# Patient Record
Sex: Female | Born: 2000 | Race: Black or African American | Hispanic: No | Marital: Single | Smoking: Never smoker
Health system: Southern US, Community
[De-identification: ages and names within clinical notes are randomized; demographics above are authoritative.]

## PROBLEM LIST (undated history)

## (undated) ENCOUNTER — Emergency Department (HOSPITAL_COMMUNITY): Discharge status: Absent without leave | Payer: Medicaid Other

---
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
## 2019-01-12 ENCOUNTER — Other Ambulatory Visit: Payer: Self-pay

## 2019-01-12 DIAGNOSIS — Z20822 Contact with and (suspected) exposure to covid-19: Secondary | ICD-10-CM

## 2019-01-13 LAB — NOVEL CORONAVIRUS, NAA: SARS-CoV-2, NAA: NOT DETECTED

## 2019-01-20 ENCOUNTER — Other Ambulatory Visit: Payer: Medicaid Other

## 2019-03-27 ENCOUNTER — Other Ambulatory Visit: Payer: Medicaid Other

## 2019-07-21 ENCOUNTER — Encounter: Payer: Medicaid Other | Admitting: Certified Nurse Midwife

## 2019-09-08 ENCOUNTER — Emergency Department
Admission: EM | Admit: 2019-09-08 | Discharge: 2019-09-09 | Disposition: A | Discharge status: Home / Self Care | Payer: Medicaid Other | Attending: Emergency Medicine | Admitting: Emergency Medicine

## 2019-09-08 ENCOUNTER — Emergency Department: Payer: Medicaid Other

## 2019-09-08 ENCOUNTER — Encounter: Payer: Self-pay | Admitting: Emergency Medicine

## 2019-09-08 DIAGNOSIS — Z20822 Contact with and (suspected) exposure to covid-19: Secondary | ICD-10-CM | POA: Insufficient documentation

## 2019-09-08 DIAGNOSIS — O99891 Other specified diseases and conditions complicating pregnancy: Secondary | ICD-10-CM | POA: Diagnosis not present

## 2019-09-08 DIAGNOSIS — Z3A15 15 weeks gestation of pregnancy: Secondary | ICD-10-CM | POA: Insufficient documentation

## 2019-09-08 DIAGNOSIS — J069 Acute upper respiratory infection, unspecified: Secondary | ICD-10-CM

## 2019-09-08 DIAGNOSIS — R102 Pelvic and perineal pain: Secondary | ICD-10-CM

## 2019-09-08 LAB — COMPREHENSIVE METABOLIC PANEL
ALT: 18 U/L (ref 0–44)
AST: 19 U/L (ref 15–41)
Albumin: 3.8 g/dL (ref 3.5–5.0)
Alkaline Phosphatase: 43 U/L (ref 38–126)
Anion gap: 7 (ref 5–15)
BUN: 7 mg/dL (ref 6–20)
CO2: 24 mmol/L (ref 22–32)
Calcium: 9.4 mg/dL (ref 8.9–10.3)
Chloride: 105 mmol/L (ref 98–111)
Creatinine, Ser: 0.64 mg/dL (ref 0.44–1.00)
GFR calc Af Amer: >60 mL/min (ref >60)
GFR calc non Af Amer: >60 mL/min (ref >60)
Glucose, Bld: 85 mg/dL (ref 70–99)
Potassium: 3.6 mmol/L (ref 3.5–5.1)
Sodium: 136 mmol/L (ref 135–145)
Total Bilirubin: 0.5 mg/dL (ref 0.3–1.2)
Total Protein: 7.4 g/dL (ref 6.5–8.1)

## 2019-09-08 LAB — CBC
HCT: 39.5 % (ref 36.0–46.0)
Hemoglobin: 13.5 g/dL (ref 12.0–15.0)
MCH: 31.2 pg (ref 26.0–34.0)
MCHC: 34.2 g/dL (ref 30.0–36.0)
MCV: 91.2 fL (ref 80.0–100.0)
Platelets: 227 10*3/uL (ref 150–400)
RBC: 4.33 MIL/uL (ref 3.87–5.11)
RDW: 12.8 % (ref 11.5–15.5)
WBC: 14.8 10*3/uL — ABNORMAL HIGH (ref 4.0–10.5)
nRBC: 0 % (ref 0.0–0.2)

## 2019-09-08 LAB — URINALYSIS, COMPLETE (UACMP) WITH MICROSCOPIC
Bilirubin Urine: NEGATIVE
Glucose, UA: NEGATIVE mg/dL
Hgb urine dipstick: NEGATIVE
Ketones, ur: 5 mg/dL — AB
Leukocytes,Ua: NEGATIVE
Nitrite: NEGATIVE
Protein, ur: NEGATIVE mg/dL
Specific Gravity, Urine: 1.016 (ref 1.005–1.030)
pH: 7 (ref 5.0–8.0)

## 2019-09-08 LAB — HCG, QUANTITATIVE, PREGNANCY: hCG, Beta Chain, Quant, S: 51111 m[IU]/mL — ABNORMAL HIGH (ref <5)

## 2019-09-08 LAB — LIPASE, BLOOD: Lipase: 26 U/L (ref 11–51)

## 2019-09-08 NOTE — ED Triage Notes (Signed)
Pt c/o cough, sore throat and nasal congestion x1 week.Today pt started to have lower abdominal pain. Pt is [redacted] weeks pregnant. Pt denies vaginal bleeding and diarrhea but has had N/V.

## 2019-09-09 ENCOUNTER — Emergency Department: Payer: Medicaid Other

## 2019-09-09 LAB — SARS CORONAVIRUS 2 BY RT PCR (HOSPITAL ORDER, PERFORMED IN ~~LOC~~ HOSPITAL LAB): SARS Coronavirus 2: NEGATIVE

## 2019-09-09 MED ORDER — DM-GUAIFENESIN ER 30-600 MG PO TB12
1.0000 | ORAL_TABLET | Freq: Two times a day (BID) | ORAL | Status: DC
  Administered 2019-09-09: 1 via ORAL

## 2019-09-09 MED ORDER — DM-GUAIFENESIN ER 30-600 MG PO TB12
1.0000 | ORAL_TABLET | Freq: Two times a day (BID) | ORAL | Status: DC
  Administered 2019-09-09: 1 via ORAL
  Filled 2019-09-09 (×2): qty 1

## 2019-09-09 MED ORDER — AMOXICILLIN-POT CLAVULANATE 875-125 MG PO TABS: 1.0000 | ORAL_TABLET | Freq: Two times a day (BID) | ORAL | 0 refills | Status: AC

## 2019-09-09 MED ORDER — AMOXICILLIN-POT CLAVULANATE 875-125 MG PO TABS
1.0000 | ORAL_TABLET | Freq: Once | ORAL | Status: AC
  Administered 2019-09-09: 1 via ORAL
  Filled 2019-09-09: qty 1

## 2019-09-09 NOTE — ED Provider Notes (Signed)
[facility] Emergency Department Provider Note  ____________________________________________   First MD Initiated Contact with Patient 09/08/19 2351     (approximate)  I have reviewed the triage vital signs and the nursing notes.   HISTORY  Chief Complaint Cough and Abdominal Pain    HPI Jasmine [name] is a 19 y.o. female G1, P0 approximately [redacted] weeks pregnant presents to the emergency department secondary to 1 week history of nonproductive cough sore throat nasal congestion.  Patient states that she had subjective fevers at home as well.  Patient states that she notes pelvic pain with coughing.  Patient denies any vaginal bleeding no discharge.       History reviewed. No pertinent past medical history.  There are no problems to display for this patient.   History reviewed. No pertinent surgical history.  Prior to Admission medications   Medication Sig Start Date End Date Taking? Authorizing Provider  albuterol (PROVENTIL HFA;VENTOLIN HFA) 108 (90 Base) MCG/ACT inhaler Inhale 2 puffs into the lungs every 6 (six) hours as needed for wheezing or shortness of breath. Patient not taking: Reported on 06/28/2017 03/04/16   Nita Sickle, MD  guaiFENesin-codeine 100-10 MG/5ML syrup Take 5 mLs by mouth every 6 (six) hours as needed for cough. 06/28/17   [name], MD  ibuprofen (ADVIL,MOTRIN) 600 MG tablet Take 1 tablet (600 mg total) every 8 (eight) hours as needed by mouth. Patient not taking: Reported on 06/28/2017 02/03/17   [name], Charlesetta Ivory, PA-C    Allergies Patient has no known allergies.  History reviewed. No pertinent family history.  Social History Social History   Tobacco Use  . Smoking status: Never Smoker  . Smokeless tobacco: Never Used  Substance Use Topics  . Alcohol use: No  . Drug use: Yes    Types: Marijuana    Review of Systems Constitutional: No fever/chills Eyes: No visual changes. ENT: Positive  for nasal congestion and sore throat Cardiovascular: Denies chest pain. Respiratory: Positive for cough Gastrointestinal: No abdominal pain.  No nausea, no vomiting.  No diarrhea.  No constipation. Genitourinary: Negative for dysuria. Musculoskeletal: Negative for neck pain.  Negative for back pain. Integumentary: Negative for rash. Neurological: Negative for headaches, focal weakness or numbness.   ____________________________________________   PHYSICAL EXAM:  VITAL SIGNS: ED Triage Vitals  Enc Vitals Group     BP 09/08/19 2138 117/87     Pulse Rate 09/08/19 2138 86     Resp 09/08/19 2138 18     Temp 09/08/19 2138 98.8 F (37.1 C)     Temp Source 09/08/19 2138 Oral     SpO2 09/08/19 2138 100 %     Weight --      Height --      Head Circumference --      Peak Flow --      Pain Score 09/09/19 0029 6     Pain Loc --      Pain Edu? --      Excl. in GC? --    Constitutional: Alert and oriented.  Eyes: Conjunctivae are normal.  Mouth/Throat: No pharyngeal erythema or exudate noted. Neck: No stridor.  No meningeal signs.   Cardiovascular: Normal rate, regular rhythm. Good peripheral circulation. Grossly normal heart sounds. Respiratory: Normal respiratory effort.  No retractions. Gastrointestinal: Soft and nontender. No distention.  Musculoskeletal: No lower extremity tenderness nor edema. No gross deformities of extremities. Neurologic:  Normal speech and language. No gross focal neurologic deficits are appreciated.  Skin:  Skin is warm, dry and intact. Psychiatric: Mood and affect are normal. Speech and behavior are normal.  ____________________________________________   LABS (all labs ordered are listed, but only abnormal results are displayed)  Labs Reviewed  CBC - Abnormal; Notable for the following components:      Result Value   WBC 14.8 (*)    All other components within normal limits  URINALYSIS, COMPLETE (UACMP) WITH MICROSCOPIC - Abnormal; Notable for the  following components:   Color, Urine YELLOW (*)    APPearance CLOUDY (*)    Ketones, ur 5 (*)    Bacteria, UA RARE (*)    All other components within normal limits  HCG, QUANTITATIVE, PREGNANCY - Abnormal; Notable for the following components:   hCG, Beta Chain, Quant, S 51,111 (*)    All other components within normal limits  SARS CORONAVIRUS 2 BY RT PCR (HOSPITAL ORDER, [place])  LIPASE, BLOOD  COMPREHENSIVE METABOLIC PANEL   _________  RADIOLOGY I, Lockhart, personally viewed and evaluated these images (plain radiographs) as part of my medical decision making, as well as reviewing the written report by the radiologist.  ED MD interpretation: No active cardiopulmonary disease noted on chest x-ray per radiologist.  Official radiology report(s): DG Chest 2 View  Result Date: 09/08/2019 CLINICAL DATA:  Cough x1 week. EXAM: CHEST - 2 VIEW COMPARISON:  June 28, 2017 FINDINGS: The heart size and mediastinal contours are within normal limits. Both lungs are clear. The visualized skeletal structures are unremarkable. IMPRESSION: No active cardiopulmonary disease. Electronically Signed   By: Virgina Norfolk M.D.   On: 09/08/2019 21:56      Procedures   ____________________________________________   INITIAL IMPRESSION / MDM / ASSESSMENT AND PLAN / ED COURSE  As part of my medical decision making, I reviewed the following data within the electronic MEDICAL RECORD NUMBER  19 year old female presented with above-stated history and physical exam a differential diagnosis including but not limited to upper respiratory infection, bronchitis, pneumonia, sinusitis,.  Chest x-ray revealed no evidence of pneumonia.  Laboratory data unremarkable ultrasound was performed secondary to stated abdominal discomfort.  Ultrasound revealed single intrauterine pregnancy 15 weeks 2 days without any acute complication per radiologist.   ____________________________________________  FINAL CLINICAL IMPRESSION(S) / ED DIAGNOSES  Final diagnoses:  Upper respiratory tract infection, unspecified type     MEDICATIONS GIVEN DURING THIS VISIT:  Medications  dextromethorphan-guaiFENesin (MUCINEX DM) 30-600 MG per 12 hr tablet 1 tablet (1 tablet Oral Given 09/09/19 0024)  amoxicillin-clavulanate (AUGMENTIN) 875-125 MG per tablet 1 tablet (1 tablet Oral Given 09/09/19 0024)     ED Discharge Orders    None      *Please note:  Jasmine [name] was evaluated in Emergency Department on 09/09/2019 for the symptoms described in the history of present illness. She was evaluated in the context of the global COVID-19 pandemic, which necessitated consideration that the patient might be at risk for infection with the SARS-CoV-2 virus that causes COVID-19. Institutional protocols and algorithms that pertain to the evaluation of patients at risk for COVID-19 are in a state of rapid change based on information released by regulatory bodies including the CDC and federal and state organizations. These policies and algorithms were followed during the patient's care in the ED.  Some ED evaluations and interventions may be delayed as a result of limited staffing during and after the pandemic.*  Note:  This document was prepared using Systems analyst  and may include unintentional dictation errors.   Darci Current, MD 09/09/19 [phone]

## 2019-09-09 NOTE — ED Notes (Signed)
US at bedside at this time 

## 2022-03-06 IMAGING — CR DG CHEST 2V
1 series · 2 of 2 positions shown · non-contrast
Comparison: June 28, 2017

CLINICAL DATA: Cough x1 week.

EXAM:
CHEST - 2 VIEW

[Series 1: dg chest 2 view · 0.14mm/px · 2 of 2 slices shown]
[im 1/2]
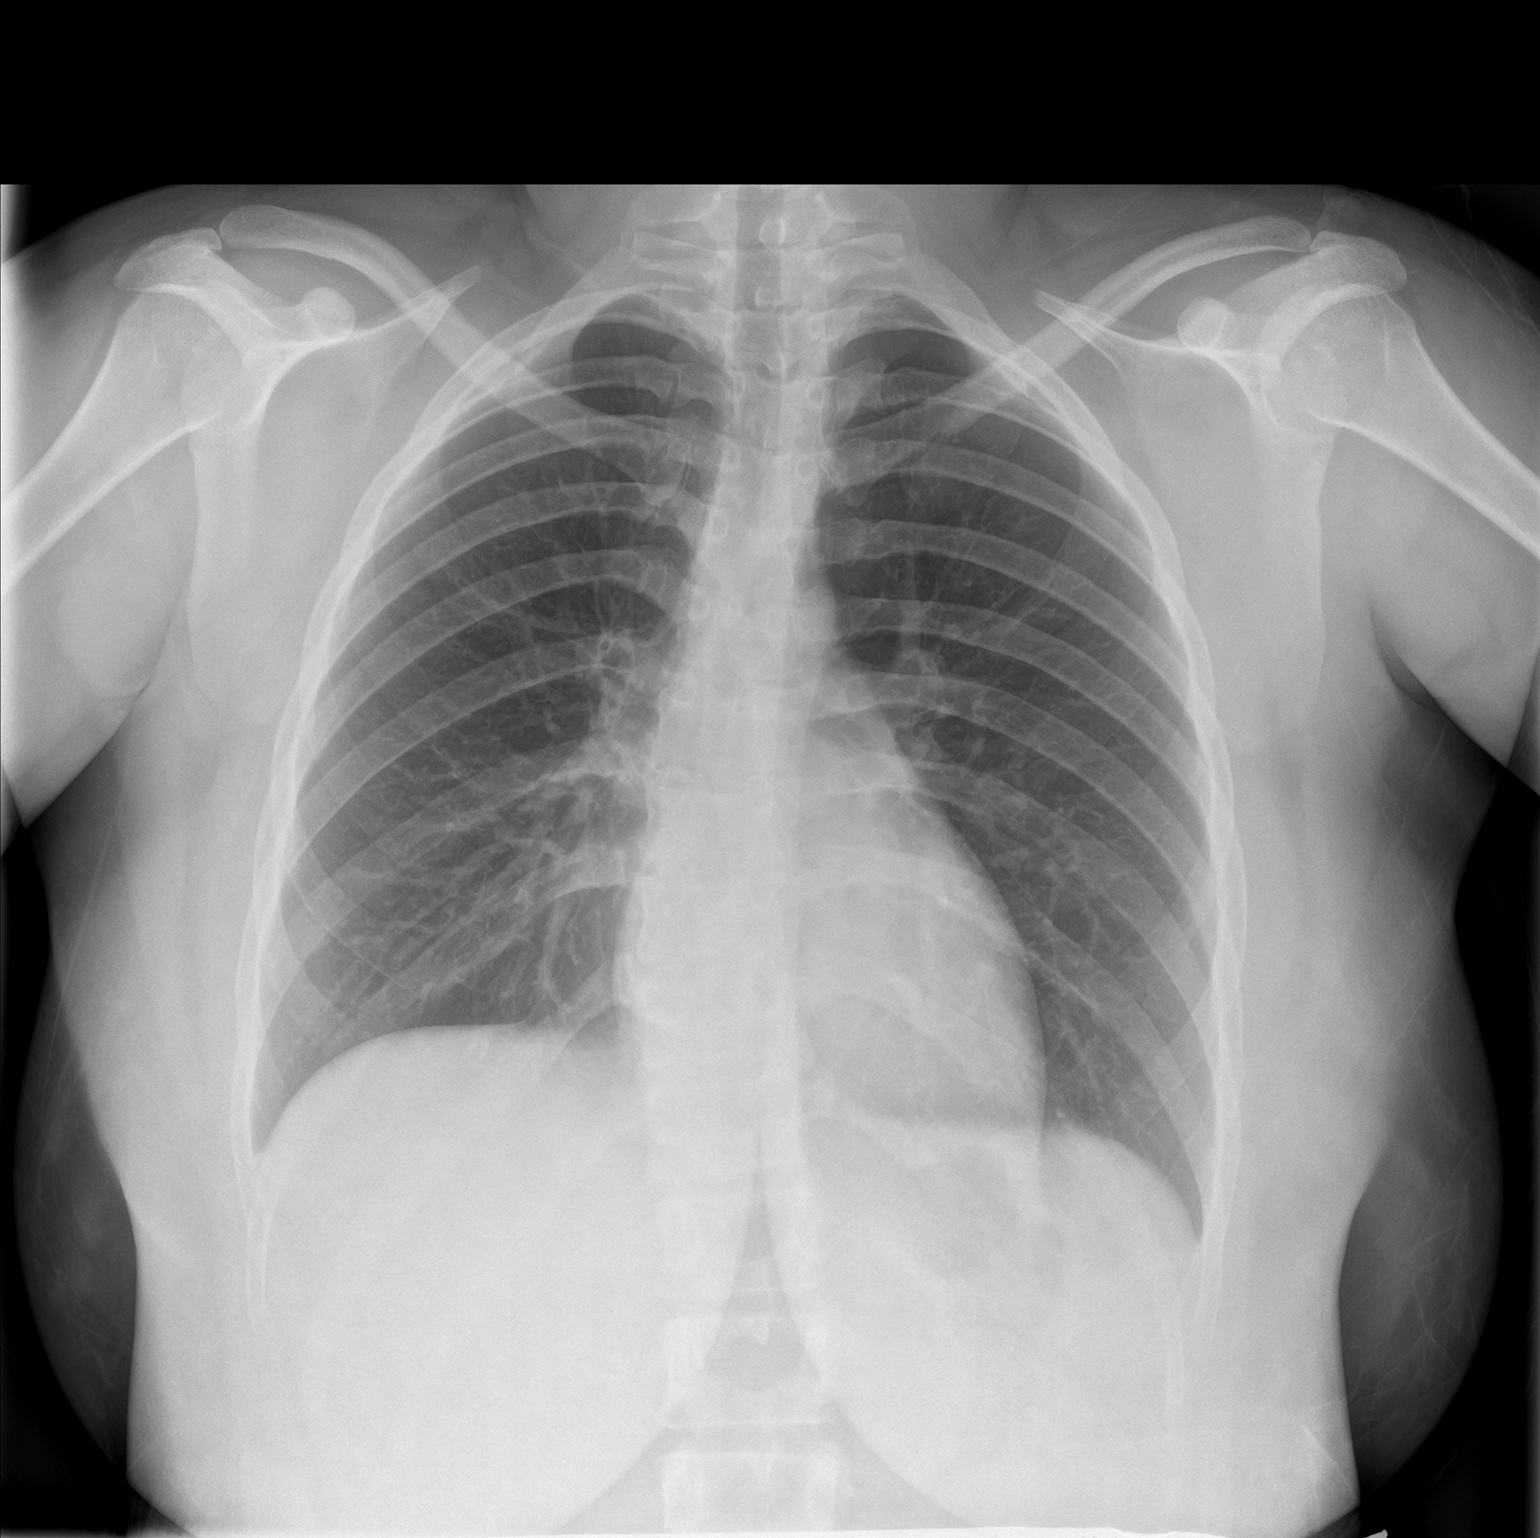
[im 2/2]
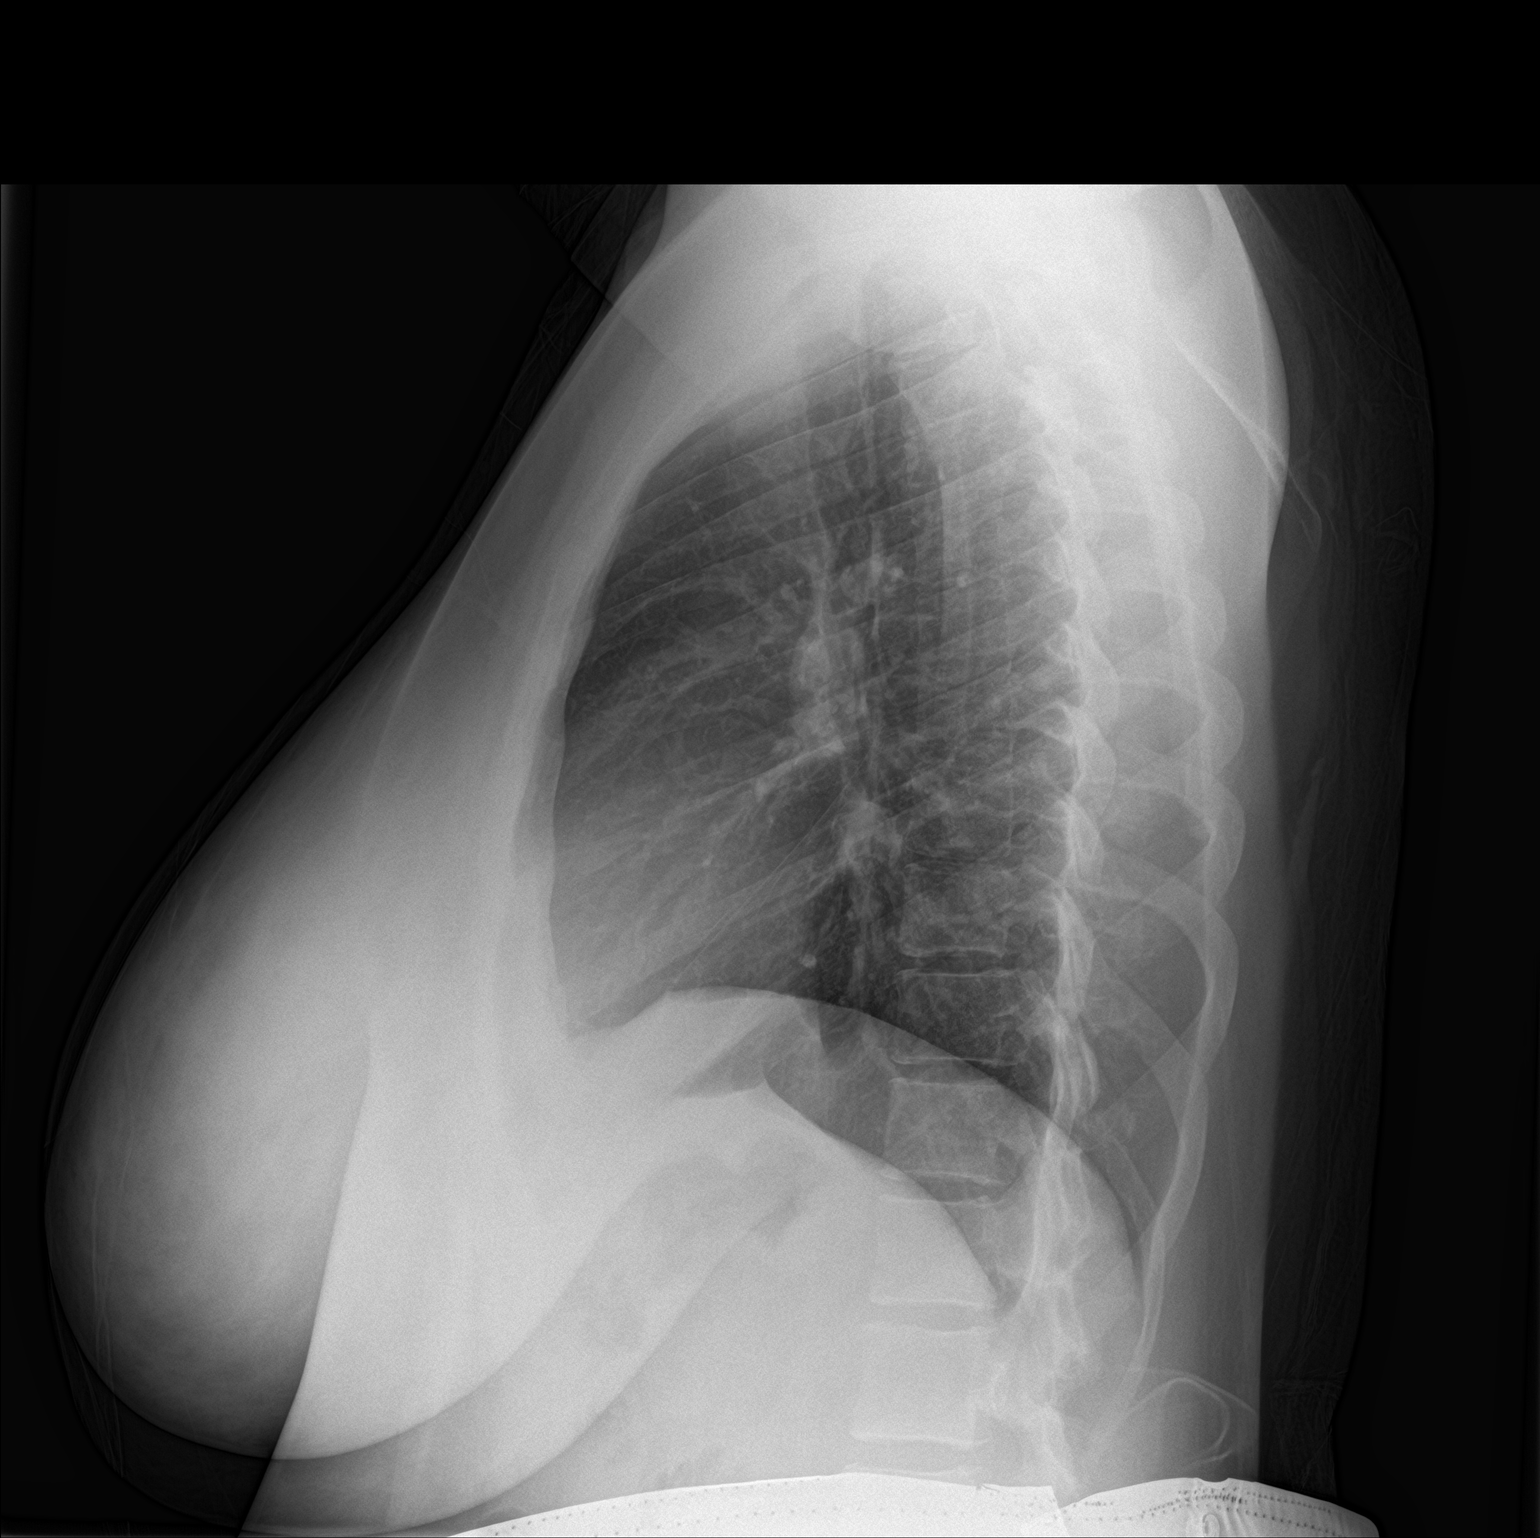

[2 of 2 positions shown; findings below may reference images not displayed]

FINDINGS: The heart size and mediastinal contours are within normal limits.
Both lungs are clear. The visualized skeletal structures are
unremarkable.
IMPRESSION: No active cardiopulmonary disease.

## 2022-07-19 ENCOUNTER — Encounter: Payer: Self-pay | Admitting: Obstetrics and Gynecology

## 2022-07-19 ENCOUNTER — Ambulatory Visit (INDEPENDENT_AMBULATORY_CARE_PROVIDER_SITE_OTHER): Payer: Medicaid Other | Admitting: Obstetrics and Gynecology

## 2022-07-19 VITALS — BP 118/82 | HR 97 | Ht 63.0 in | Wt 213.0 lb

## 2022-07-19 DIAGNOSIS — Z7689 Persons encountering health services in other specified circumstances: Secondary | ICD-10-CM

## 2022-07-19 DIAGNOSIS — N750 Cyst of Bartholin's gland: Secondary | ICD-10-CM | POA: Diagnosis not present

## 2022-07-19 NOTE — Progress Notes (Signed)
HPI:      [name] is a 22 y.o. G1P0 who LMP was Patient's last menstrual period was 06/28/2022.  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  Reviewing her chart from [facility] is noted that she had a Bartholin cyst where a Word catheter was placed.  Patient states that she has a follow-up appointment scheduled in [facility] in June for possible surgical management.  She desires surgical management. She reports that today she has a small cystic area present but it is not as tender as it usually gets and not as large as it usually gets. She also states that she has previously been told it might be a Gartner's duct cyst.    Hx: The following portions of the patient's history were reviewed and updated as appropriate:             She  has no past medical history on file. She does not have a problem list on file. She  has no past surgical history on file. Her family history is not on file. She  reports that she has never smoked. She has never used smokeless tobacco. She reports current drug use. Drug: Marijuana. She reports that she does not drink alcohol. She has a current medication list which includes the following prescription(s): albuterol, guaifenesin-codeine, and ibuprofen. She has No Known Allergies.       Review of Systems:  Review of Systems  Constitutional: Denied constitutional symptoms, night sweats, recent illness, fatigue, fever, insomnia and weight loss.  Eyes: Denied eye symptoms, eye pain, photophobia, vision change and visual disturbance.  Ears/Nose/Throat/Neck: Denied ear, nose, throat or neck symptoms, hearing loss, nasal discharge, sinus congestion and sore throat.  Cardiovascular: Denied cardiovascular symptoms, arrhythmia, chest pain/pressure, edema, exercise intolerance, orthopnea and palpitations.  Respiratory: Denied pulmonary symptoms,  asthma, pleuritic pain, productive sputum, cough, dyspnea and wheezing.  Gastrointestinal: Denied, gastro-esophageal reflux, melena, nausea and vomiting.  Genitourinary: See HPI for additional information.  Musculoskeletal: Denied musculoskeletal symptoms, stiffness, swelling, muscle weakness and myalgia.  Dermatologic: Denied dermatology symptoms, rash and scar.  Neurologic: Denied neurology symptoms, dizziness, headache, neck pain and syncope.  Psychiatric: Denied psychiatric symptoms, anxiety and depression.  Endocrine: Denied endocrine symptoms including hot flashes and night sweats.   Meds:   Current Outpatient Medications on File Prior to Visit  Medication Sig Dispense Refill   albuterol (PROVENTIL HFA;VENTOLIN HFA) 108 (90 Base) MCG/ACT inhaler Inhale 2 puffs into the lungs every 6 (six) hours as needed for wheezing or shortness of breath. (Patient not taking: Reported on 06/28/2017) 1 Inhaler 2   guaiFENesin-codeine 100-10 MG/5ML syrup Take 5 mLs by mouth every 6 (six) hours as needed for cough. (Patient not taking: Reported on 07/19/2022) 120 mL 0   ibuprofen (ADVIL,MOTRIN) 600 MG tablet Take 1 tablet (600 mg total) every 8 (eight) hours as needed by mouth. (Patient not taking: Reported on 06/28/2017) 30 tablet 0   No current facility-administered medications on file prior to visit.      Objective:     Vitals:   07/19/22 1354  BP: 118/82  Pulse: 97   Filed Weights   07/19/22 1354  Weight: 213 lb (96.6 kg)              Physical examination   Pelvic:   Vulva: Small left labial cystic area consistent with possible Bartholin gland cyst.  Minimal tenderness to palpation.  Vagina: No lesions or abnormalities noted.  Support: Normal pelvic support.  Urethra No masses tenderness or scarring.  Meatus Normal size without lesions or prolapse.  Cervix: Normal appearance.  No lesions.  Anus: Normal exam.  No lesions.  Perineum: Normal exam.  No lesions.        Bimanual   Uterus:  Normal size.  Non-tender.  Mobile.  AV.  Adnexae: No masses.  Non-tender to palpation.  Cul-de-sac: Negative for abnormality.             Assessment:    G1P0 There are no problems to display for this patient.    1. Establishing care with new doctor, encounter for   2. 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  Not a Gartner's duct cyst.   Plan:            1.  Have encouraged patient to continue care in [facility] as her existing appointment in June.  She is wanting them to surgically remove the entire gland so recurrences do not happen. Orders No orders of the defined types were placed in this encounter.   No orders of the defined types were placed in this encounter.     F/U  No follow-ups on file. I spent 31 minutes involved in the care of this patient preparing to see the patient by obtaining and reviewing her medical history (including labs, imaging tests and prior procedures), documenting clinical information in the electronic health record (EHR), counseling and coordinating care plans, writing and sending prescriptions, ordering tests or procedures and in direct communicating with the patient and medical staff discussing pertinent items from her history and physical exam.  Elonda Husky, M.D. 07/19/2022 2:22 PM

## 2022-07-19 NOTE — Progress Notes (Signed)
Patient presents to discuss current bartholin cyst. She states a history of these cysts since 2020. She states some current pain with wiping.

## 2023-11-04 NOTE — Congregational Nurse Program (Signed)
  Dept: [phone]   Congregational Nurse Program Note  Date of Encounter: 11/02/2023  Provided handout for know your numbers and knock out high blood pressure. Provided cold bottle of water.  Past Medical History: No past medical history on file.  Encounter Details:  Community Questionnaire - 11/04/23 0805       Questionnaire   Ask client: Do you give verbal consent for me to treat you today? Yes    Student Assistance N/A    Location Patient Served  N/A    Encounter Setting Other   [employer]   Population Status Unknown    Insurance Medicaid    Insurance/Financial Assistance Referral N/A    Medication N/A    Medical Provider Yes    Screening Referrals Made N/A    Medical Referrals Made N/A    Medical Appointment Completed N/A    CNP Interventions Advocate/Support;Educate    Screenings CN Performed Blood Pressure    ED Visit Averted N/A    Life-Saving Intervention Made N/A          Today's Vitals   11/04/23 0805  BP: 136/84   There is no height or weight on file to calculate BMI.
